# Patient Record
Sex: Male | Born: 1990 | Race: White | Hispanic: No | Marital: Single | Smoking: Never smoker
Health system: Southern US, Community
[De-identification: ages and names within clinical notes are randomized; demographics above are authoritative.]

## PROBLEM LIST (undated history)

## (undated) DIAGNOSIS — Z789 Other specified health status: Secondary | ICD-10-CM

## (undated) HISTORY — PX: KIDNEY SURGERY: SHX687

---

## 2015-08-18 NOTE — Progress Notes (Signed)
Please put orders in Epic surgery 09-08-15 pre op 09-03-15 Thanks 

## 2015-08-27 NOTE — Patient Instructions (Signed)
Shane Waters  08/27/2015   Your procedure is scheduled on: September 08, 2015  Report to [facility] Main  Entrance take [facility]  elevators to 3rd floor to  Short Stay Center at 7:30  AM.  Call this number if you have problems the morning of surgery [phone]   Remember: ONLY 1 PERSON MAY GO WITH YOU TO SHORT STAY TO GET  READY MORNING OF YOUR SURGERY.  Do not eat food or drink liquids :After Midnight.     Take these medicines the morning of surgery with A SIP OF WATER: None                               You may not have any metal on your body including hair pins and              piercings  Do not wear jewelry, , lotions, powders or perfumes, deodorant                        Men may shave face and neck.   Do not bring valuables to the hospital. [facility] IS NOT             RESPONSIBLE   FOR VALUABLES.  Contacts, dentures or bridgework may not be worn into surgery.       Patients discharged the day of surgery will not be allowed to drive home.  Name and phone number of your driver:  Special Instructions: coughing and deep breathing exercises, leg exercises              Please read over the following fact sheets you were given: _____________________________________________________________________             [facility] - Preparing for Surgery Before surgery, you can play an important role.  Because skin is not sterile, your skin needs to be as free of germs as possible.  You can reduce the number of germs on your skin by washing with CHG (chlorahexidine gluconate) soap before surgery.  CHG is an antiseptic cleaner which kills germs and bonds with the skin to continue killing germs even after washing. Please DO NOT use if you have an allergy to CHG or antibacterial soaps.  If your skin becomes reddened/irritated stop using the CHG and inform your nurse when you arrive at Short Stay. Do not shave (including legs and underarms) for at least 48 hours prior to  the first CHG shower.  You may shave your face/neck. Please follow these instructions carefully:  1.  Shower with CHG Soap the night before surgery and the  morning of Surgery.  2.  If you choose to wash your hair, wash your hair first as usual with your  normal  shampoo.  3.  After you shampoo, rinse your hair and body thoroughly to remove the  shampoo.                           4.  Use CHG as you would any other liquid soap.  You can apply chg directly  to the skin and wash                       Gently with a scrungie or clean washcloth.  5.  Apply the CHG Soap  to your body ONLY FROM THE NECK DOWN.   Do not use on face/ open                           Wound or open sores. Avoid contact with eyes, ears mouth and genitals (private parts).                       Wash face,  Genitals (private parts) with your normal soap.             6.  Wash thoroughly, paying special attention to the area where your surgery  will be performed.  7.  Thoroughly rinse your body with warm water from the neck down.  8.  DO NOT shower/wash with your normal soap after using and rinsing off  the CHG Soap.                9.  Pat yourself dry with a clean towel.            10.  Wear clean pajamas.            11.  Place clean sheets on your bed the night of your first shower and do not  sleep with pets. Day of Surgery : Do not apply any lotions/deodorants the morning of surgery.  Please wear clean clothes to the hospital/surgery center.  FAILURE TO FOLLOW THESE INSTRUCTIONS MAY RESULT IN THE CANCELLATION OF YOUR SURGERY PATIENT SIGNATURE_________________________________  NURSE SIGNATURE__________________________________  ________________________________________________________________________

## 2015-08-27 NOTE — Progress Notes (Signed)
08-27-15 - Need orders for surgery on September 28.  Patients preop appointment in on 09-03-15- at 1300. Thank you

## 2015-09-02 ENCOUNTER — Ambulatory Visit: Payer: Self-pay | Admitting: Surgery

## 2015-09-03 ENCOUNTER — Encounter (HOSPITAL_COMMUNITY)
Admission: RE | Admit: 2015-09-03 | Discharge: 2015-09-03 | Disposition: A | Discharge status: Home / Self Care | Payer: BLUE CROSS/BLUE SHIELD | Source: Ambulatory Visit | Attending: Surgery | Admitting: Surgery

## 2015-09-03 ENCOUNTER — Encounter (HOSPITAL_COMMUNITY): Payer: Self-pay

## 2015-09-03 DIAGNOSIS — K4090 Unilateral inguinal hernia, without obstruction or gangrene, not specified as recurrent: Secondary | ICD-10-CM | POA: Insufficient documentation

## 2015-09-03 DIAGNOSIS — Z01818 Encounter for other preprocedural examination: Secondary | ICD-10-CM | POA: Diagnosis not present

## 2015-09-03 HISTORY — DX: Other specified health status: Z78.9

## 2015-09-07 NOTE — Progress Notes (Signed)
Patient called and notified to arrive at Short Stay at [facility] at 0630 am . Patient verbalized understanding.

## 2015-09-07 NOTE — Anesthesia Preprocedure Evaluation (Addendum)
Anesthesia Evaluation  Patient identified by MRN, date of birth, ID band Patient awake    Reviewed: Allergy & Precautions, H&P , NPO status , Patient's Chart, lab work & pertinent test results  History of Anesthesia Complications Negative for: history of anesthetic complications  Airway Mallampati: I  TM Distance: >3 FB Neck ROM: full    Dental no notable dental hx.    Pulmonary neg pulmonary ROS,    Pulmonary exam normal breath sounds clear to auscultation       Cardiovascular negative cardio ROS  I Rhythm:regular Rate:Normal     Neuro/Psych negative neurological ROS  negative psych ROS   GI/Hepatic negative GI ROS, Neg liver ROS,   Endo/Other  negative endocrine ROS  Renal/GU negative Renal ROS  negative genitourinary   Musculoskeletal   Abdominal   Peds  Hematology negative hematology ROS (+)   Anesthesia Other Findings   Reproductive/Obstetrics negative OB ROS                             Anesthesia Physical Anesthesia Plan  ASA: I  Anesthesia Plan: General   Post-op Pain Management: GA combined w/ Regional for post-op pain   Induction:   Airway Management Planned: LMA  Additional Equipment:   Intra-op Plan:   Post-operative Plan: Extubation in OR  Informed Consent: I have reviewed the patients History and Physical, chart, labs and discussed the procedure including the risks, benefits and alternatives for the proposed anesthesia with the patient or authorized representative who has indicated his/her understanding and acceptance.     Plan Discussed with: CRNA and Surgeon  Anesthesia Plan Comments:         Anesthesia Quick Evaluation

## 2015-09-08 ENCOUNTER — Ambulatory Visit (HOSPITAL_COMMUNITY)
Admission: RE | Admit: 2015-09-08 | Discharge: 2015-09-08 | Disposition: A | Discharge status: Home / Self Care | Payer: BLUE CROSS/BLUE SHIELD | Source: Ambulatory Visit | Attending: Surgery | Admitting: Surgery

## 2015-09-08 ENCOUNTER — Encounter (HOSPITAL_COMMUNITY): Payer: Self-pay

## 2015-09-08 ENCOUNTER — Ambulatory Visit (HOSPITAL_COMMUNITY): Payer: BLUE CROSS/BLUE SHIELD | Admitting: Anesthesiology

## 2015-09-08 ENCOUNTER — Encounter (HOSPITAL_COMMUNITY)
Admission: RE | Disposition: A | Discharge status: Home / Self Care | Payer: Self-pay | Source: Ambulatory Visit | Attending: Surgery

## 2015-09-08 DIAGNOSIS — K4090 Unilateral inguinal hernia, without obstruction or gangrene, not specified as recurrent: Secondary | ICD-10-CM | POA: Insufficient documentation

## 2015-09-08 DIAGNOSIS — Z905 Acquired absence of kidney: Secondary | ICD-10-CM | POA: Diagnosis not present

## 2015-09-08 HISTORY — PX: INGUINAL HERNIA REPAIR: SHX194

## 2015-09-08 HISTORY — PX: INSERTION OF MESH: SHX5868

## 2015-09-08 SURGERY — REPAIR, HERNIA, INGUINAL, ADULT
Anesthesia: General | Site: Groin | Laterality: Left

## 2015-09-08 MED ORDER — 0.9 % SODIUM CHLORIDE (POUR BTL) OPTIME
TOPICAL | Status: DC | PRN
  Administered 2015-09-08: 1000 mL

## 2015-09-08 MED ORDER — PROPOFOL 10 MG/ML IV BOLUS
INTRAVENOUS | Status: DC | PRN
  Administered 2015-09-08 (×2): 200 mg via INTRAVENOUS

## 2015-09-08 MED ORDER — SODIUM CHLORIDE 0.9 % IJ SOLN: 3.0000 mL | INTRAMUSCULAR | Status: DC | PRN

## 2015-09-08 MED ORDER — CEFAZOLIN SODIUM-DEXTROSE 2-3 GM-% IV SOLR
2.0000 g | INTRAVENOUS | Status: AC
  Administered 2015-09-08: 2 g via INTRAVENOUS

## 2015-09-08 MED ORDER — CEFAZOLIN SODIUM-DEXTROSE 2-3 GM-% IV SOLR
INTRAVENOUS | Status: AC
  Filled 2015-09-08: qty 50

## 2015-09-08 MED ORDER — MIDAZOLAM HCL 2 MG/2ML IJ SOLN
INTRAMUSCULAR | Status: AC
  Filled 2015-09-08: qty 4

## 2015-09-08 MED ORDER — ACETAMINOPHEN 325 MG PO TABS: 650.0000 mg | ORAL_TABLET | ORAL | Status: DC | PRN

## 2015-09-08 MED ORDER — CHLORHEXIDINE GLUCONATE 4 % EX LIQD: 1.0000 "application " | Freq: Once | CUTANEOUS | Status: DC

## 2015-09-08 MED ORDER — PROPOFOL 10 MG/ML IV BOLUS
INTRAVENOUS | Status: AC
  Filled 2015-09-08: qty 20

## 2015-09-08 MED ORDER — ONDANSETRON HCL 4 MG/2ML IJ SOLN
INTRAMUSCULAR | Status: AC
  Filled 2015-09-08: qty 2

## 2015-09-08 MED ORDER — FENTANYL CITRATE (PF) 250 MCG/5ML IJ SOLN
INTRAMUSCULAR | Status: AC
  Filled 2015-09-08: qty 25

## 2015-09-08 MED ORDER — LIDOCAINE HCL 1 % IJ SOLN
INTRAMUSCULAR | Status: DC | PRN
  Administered 2015-09-08: 100 mg via INTRADERMAL

## 2015-09-08 MED ORDER — OXYCODONE HCL 5 MG PO TABS: 5.0000 mg | ORAL_TABLET | ORAL | Status: DC | PRN

## 2015-09-08 MED ORDER — BUPIVACAINE-EPINEPHRINE 0.5% -1:200000 IJ SOLN
INTRAMUSCULAR | Status: AC
  Filled 2015-09-08: qty 1

## 2015-09-08 MED ORDER — SODIUM CHLORIDE 0.9 % IV SOLN: 250.0000 mL | INTRAVENOUS | Status: DC | PRN

## 2015-09-08 MED ORDER — PROMETHAZINE HCL 25 MG/ML IJ SOLN: 6.2500 mg | INTRAMUSCULAR | Status: DC | PRN

## 2015-09-08 MED ORDER — HYDROMORPHONE HCL 1 MG/ML IJ SOLN
INTRAMUSCULAR | Status: AC
  Administered 2015-09-08: 0.5 mg
  Filled 2015-09-08: qty 1

## 2015-09-08 MED ORDER — HYDROMORPHONE HCL 1 MG/ML IJ SOLN: 0.2500 mg | INTRAMUSCULAR | Status: DC | PRN

## 2015-09-08 MED ORDER — SODIUM CHLORIDE 0.9 % IJ SOLN: 3.0000 mL | Freq: Two times a day (BID) | INTRAMUSCULAR | Status: DC

## 2015-09-08 MED ORDER — LIDOCAINE HCL 1 % IJ SOLN
INTRAMUSCULAR | Status: AC
  Filled 2015-09-08: qty 20

## 2015-09-08 MED ORDER — HYDROCODONE-ACETAMINOPHEN 5-325 MG PO TABS
1.0000 | ORAL_TABLET | Freq: Once | ORAL | Status: AC
  Administered 2015-09-08: 1 via ORAL
  Filled 2015-09-08: qty 1

## 2015-09-08 MED ORDER — HYDROMORPHONE HCL 1 MG/ML IJ SOLN
0.2500 mg | INTRAMUSCULAR | Status: DC | PRN
  Administered 2015-09-08: 0.5 mg via INTRAVENOUS

## 2015-09-08 MED ORDER — FENTANYL CITRATE (PF) 100 MCG/2ML IJ SOLN
INTRAMUSCULAR | Status: DC | PRN
  Administered 2015-09-08 (×7): 50 ug via INTRAVENOUS

## 2015-09-08 MED ORDER — MIDAZOLAM HCL 5 MG/5ML IJ SOLN
INTRAMUSCULAR | Status: DC | PRN
  Administered 2015-09-08 (×2): 1 mg via INTRAVENOUS

## 2015-09-08 MED ORDER — ROCURONIUM BROMIDE 100 MG/10ML IV SOLN
INTRAVENOUS | Status: AC
  Filled 2015-09-08: qty 1

## 2015-09-08 MED ORDER — HEPARIN SODIUM (PORCINE) 5000 UNIT/ML IJ SOLN
5000.0000 [IU] | Freq: Once | INTRAMUSCULAR | Status: AC
  Administered 2015-09-08: 5000 [IU] via SUBCUTANEOUS
  Filled 2015-09-08: qty 1

## 2015-09-08 MED ORDER — SODIUM CHLORIDE 0.9 % IJ SOLN
INTRAMUSCULAR | Status: AC
  Filled 2015-09-08: qty 10

## 2015-09-08 MED ORDER — HYDROCODONE-ACETAMINOPHEN 5-325 MG PO TABS: 1.0000 | ORAL_TABLET | ORAL | Status: DC | PRN

## 2015-09-08 MED ORDER — ACETAMINOPHEN 650 MG RE SUPP: 650.0000 mg | RECTAL | Status: DC | PRN

## 2015-09-08 MED ORDER — BUPIVACAINE LIPOSOME 1.3 % IJ SUSP
20.0000 mL | Freq: Once | INTRAMUSCULAR | Status: AC
  Administered 2015-09-08: 12 mL
  Filled 2015-09-08: qty 20

## 2015-09-08 MED ORDER — LIDOCAINE HCL (CARDIAC) 20 MG/ML IV SOLN
INTRAVENOUS | Status: AC
  Filled 2015-09-08: qty 5

## 2015-09-08 MED ORDER — BUPIVACAINE-EPINEPHRINE (PF) 0.25% -1:200000 IJ SOLN
INTRAMUSCULAR | Status: AC
  Filled 2015-09-08: qty 30

## 2015-09-08 MED ORDER — FENTANYL CITRATE (PF) 100 MCG/2ML IJ SOLN
INTRAMUSCULAR | Status: AC
  Filled 2015-09-08: qty 2

## 2015-09-08 MED ORDER — LACTATED RINGERS IV SOLN
INTRAVENOUS | Status: DC | PRN
  Administered 2015-09-08 (×2): via INTRAVENOUS

## 2015-09-08 SURGICAL SUPPLY — 34 items
BENZOIN TINCTURE PRP APPL 2/3 (GAUZE/BANDAGES/DRESSINGS) IMPLANT
BLADE HEX COATED 2.75 (ELECTRODE) ×3 IMPLANT
BLADE SURG 15 STRL LF DISP TIS (BLADE) ×1 IMPLANT
BLADE SURG 15 STRL SS (BLADE) ×2
CLOSURE WOUND 1/2 X4 (GAUZE/BANDAGES/DRESSINGS)
COVER SURGICAL LIGHT HANDLE (MISCELLANEOUS) ×3 IMPLANT
DECANTER SPIKE VIAL GLASS SM (MISCELLANEOUS) IMPLANT
DISSECTOR ROUND CHERRY 3/8 STR (MISCELLANEOUS) ×3 IMPLANT
DRAIN PENROSE 18X1/2 LTX STRL (DRAIN) ×3 IMPLANT
DRAPE LAPAROTOMY TRNSV 102X78 (DRAPE) ×3 IMPLANT
ELECT PENCIL ROCKER SW 15FT (MISCELLANEOUS) ×3 IMPLANT
ELECT REM PT RETURN 9FT ADLT (ELECTROSURGICAL) ×3
ELECTRODE REM PT RTRN 9FT ADLT (ELECTROSURGICAL) ×1 IMPLANT
GLOVE BIOGEL M 8.0 STRL (GLOVE) ×3 IMPLANT
GOWN STRL REUS W/TWL XL LVL3 (GOWN DISPOSABLE) ×6 IMPLANT
KIT BASIN OR (CUSTOM PROCEDURE TRAY) ×3 IMPLANT
LIQUID BAND (GAUZE/BANDAGES/DRESSINGS) ×3 IMPLANT
MESH HERNIA 3X6 (Mesh General) ×3 IMPLANT
NEEDLE HYPO 22GX1.5 SAFETY (NEEDLE) ×3 IMPLANT
PACK BASIC VI WITH GOWN DISP (CUSTOM PROCEDURE TRAY) ×3 IMPLANT
SPONGE LAP 4X18 X RAY DECT (DISPOSABLE) ×3 IMPLANT
STAPLER VISISTAT 35W (STAPLE) IMPLANT
STRIP CLOSURE SKIN 1/2X4 (GAUZE/BANDAGES/DRESSINGS) IMPLANT
SUT PROLENE 2 0 CT2 30 (SUTURE) ×9 IMPLANT
SUT SILK 2 0 SH (SUTURE) ×3 IMPLANT
SUT VIC AB 2-0 SH 27 (SUTURE) ×2
SUT VIC AB 2-0 SH 27X BRD (SUTURE) ×1 IMPLANT
SUT VIC AB 4-0 SH 18 (SUTURE) ×3 IMPLANT
SUT VICRYL 4-0 (SUTURE) ×3 IMPLANT
SYR 20CC LL (SYRINGE) ×3 IMPLANT
SYR BULB IRRIGATION 50ML (SYRINGE) ×3 IMPLANT
TOWEL OR 17X26 10 PK STRL BLUE (TOWEL DISPOSABLE) ×3 IMPLANT
TOWEL OR NON WOVEN STRL DISP B (DISPOSABLE) ×3 IMPLANT
YANKAUER SUCT BULB TIP 10FT TU (MISCELLANEOUS) ×3 IMPLANT

## 2015-09-08 NOTE — Discharge Instructions (Signed)
Inguinal Hernia, Adult  °Care After °Refer to this sheet in the next few weeks. These discharge instructions provide you with general information on caring for yourself after you leave the hospital. Your caregiver may also give you specific instructions. Your treatment has been planned according to the most current medical practices available, but unavoidable complications sometimes occur. If you have any problems or questions after discharge, please call your caregiver. °HOME CARE INSTRUCTIONS °· Put ice on the operative site. °¨ Put ice in a plastic bag. °¨ Place a towel between your skin and the bag. °¨ Leave the ice on for 15-20 minutes at a time, 03-04 times a day while awake. °· Change bandages (dressings) as directed. °· Keep the wound dry and clean. The wound may be washed gently with soap and water. Gently blot or dab the wound dry. It is okay to take showers 24 to 48 hours after surgery. Do not take baths, use swimming pools, or use hot tubs for 10 days, or as directed by your caregiver. °· Only take over-the-counter or prescription medicines for pain, discomfort, or fever as directed by your caregiver. °· Continue your normal diet as directed. °· Do not lift anything more than 10 pounds or play contact sports for 3 weeks, or as directed. °SEEK MEDICAL CARE IF: °· There is redness, swelling, or increasing pain in the wound. °· There is fluid (pus) coming from the wound. °· There is drainage from a wound lasting longer than 1 day. °· You have an oral temperature above 102° F (38.9° C). °· You notice a bad smell coming from the wound or dressing. °· The wound breaks open after the stitches (sutures) have been removed. °· You notice increasing pain in the shoulders (shoulder strap areas). °· You develop dizzy episodes or fainting while standing. °· You feel sick to your stomach (nauseous) or throw up (vomit). °SEEK IMMEDIATE MEDICAL CARE IF: °· You develop a rash. °· You have difficulty breathing. °· You  develop a reaction or have side effects to medicines you were given. °MAKE SURE YOU:  °· Understand these instructions. °· Will watch your condition. °· Will get help right away if you are not doing well or get worse. °Document Released: 12/28/2006 Document Revised: 02/19/2012 Document Reviewed: 10/27/2009 °ExitCare® Patient Information ©2015 [employer]. This information is not intended to replace advice given to you by your health care provider. Make sure you discuss any questions you have with your health care provider. ° °

## 2015-09-08 NOTE — Transfer of Care (Signed)
Immediate Anesthesia Transfer of Care Note  Patient: Shane Waters  Procedure(s) Performed: Procedure(s): OPEN LEFT INGUINAL HERNIA REPAIR WITH MESH (Left) INSERTION OF MESH (Left)  Patient Location: PACU  Anesthesia Type:General  Level of Consciousness: awake, alert  and sedated  Airway & Oxygen Therapy: Patient Spontanous Breathing and Patient connected to face mask oxygen  Post-op Assessment: Report given to RN and Post -op Vital signs reviewed and stable  Post vital signs: Reviewed and stable  Last Vitals:  Filed Vitals:   09/08/15 0628  BP: 121/75  Pulse: 73  Temp: 36.6 C  Resp: 16    Complications: No apparent anesthesia complications

## 2015-09-08 NOTE — Interval H&P Note (Signed)
History and Physical Interval Note:  09/08/2015 8:07 AM  Shane Waters  has presented today for surgery, with the diagnosis of LEFT INGUINAL HERNIA  The various methods of treatment have been discussed with the patient and family. After consideration of risks, benefits and other options for treatment, the patient has consented to  Procedure(s): OPEN LEFT INGUINAL HERNIA REPAIR WITH MESH (Left) INSERTION OF MESH (Left) as a surgical intervention .  The patient's history has been reviewed, patient examined, no change in status, stable for surgery.  I have reviewed the patient's chart and labs.  Questions were answered to the patient's satisfaction.     MARTIN,MATTHEW B

## 2015-09-08 NOTE — H&P (Signed)
Shane Waters  Location: [facility] Patient #: [id number] DOB: [birth year]  Male  History of Present Illness  Patient words: LIH  24 year old employee of [employer] with a several month history of a left inguinal hernia. It has not obstructed but is bothering him. His father had a hernia repair in [place] last year and he is aware of the procedure. I explained the procedure again to him including complications of nerve pain, numbness, and recurrence. Hernia book given to him. He wants to proceed with open left inguinal hernia repair.  T    Other Problems Fay Records, CMA; 07/29/2015 10:05 AM) Inguinal Hernia  Past Surgical History Fay Records, CMA; 07/29/2015 10:05 AM) Nephrectomy Right.  Diagnostic Studies History Fay Records, [place]; 07/29/2015 10:05 AM) Colonoscopy never  Allergies Fay Records, CMA; 07/29/2015 10:05 AM) No Known Drug Allergies08/18/2016  Medication History Fay Records, [place]; 07/29/2015 10:05 AM) No Current Medications Medications Reconciled  Social History Fay Records, [place]; 07/29/2015 10:05 AM) Alcohol use Occasional alcohol use. Caffeine use Carbonated beverages. No drug use Tobacco use Never smoker.  Family History Fay Records, [place]; 07/29/2015 10:05 AM) Heart disease in male family member before age 15  Review of Systems Fay Records CMA; 07/29/2015 10:05 AM) General Not Present- Appetite Loss, Chills, Fatigue, Fever, Night Sweats, Weight Gain and Weight Loss. Skin Not Present- Change in Wart/Mole, Dryness, Hives, Jaundice, New Lesions, Non-Healing Wounds, Rash and Ulcer. HEENT Not Present- Earache, Hearing Loss, Hoarseness, Nose Bleed, Oral Ulcers, Ringing in the Ears, Seasonal Allergies, Sinus Pain, Sore Throat, Visual Disturbances, Wears glasses/contact lenses and Yellow Eyes. Respiratory Not Present- Bloody sputum, Chronic Cough, Difficulty Breathing, Snoring and Wheezing. Breast Not Present- Breast Mass, Breast Pain, Nipple Discharge  and Skin Changes. Cardiovascular Not Present- Chest Pain, Difficulty Breathing Lying Down, Leg Cramps, Palpitations, Rapid Heart Rate, Shortness of Breath and Swelling of Extremities. Gastrointestinal Not Present- Abdominal Pain, Bloating, Bloody Stool, Change in Bowel Habits, Chronic diarrhea, Constipation, Difficulty Swallowing, Excessive gas, Gets full quickly at meals, Hemorrhoids, Indigestion, Nausea, Rectal Pain and Vomiting. Male Genitourinary Not Present- Blood in Urine, Change in Urinary Stream, Frequency, Impotence, Nocturia, Painful Urination, Urgency and Urine Leakage. Musculoskeletal Not Present- Back Pain, Joint Pain, Joint Stiffness, Muscle Pain, Muscle Weakness and Swelling of Extremities. Neurological Not Present- Decreased Memory, Fainting, Headaches, Numbness, Seizures, Tingling, Tremor, Trouble walking and Weakness. Psychiatric Not Present- Anxiety, Bipolar, Change in Sleep Pattern, Depression, Fearful and Frequent crying. Endocrine Not Present- Cold Intolerance, Excessive Hunger, Hair Changes, Heat Intolerance, Hot flashes and New Diabetes. Hematology Not Present- Easy Bruising, Excessive bleeding, Gland problems, HIV and Persistent Infections.   Vitals Fay Records CMA; 07/29/2015 10:06 AM) 07/29/2015 10:05 AM Weight: 198 lb Height: 73in Body Surface Area: 2.15 m Body Mass Index: 26.12 kg/m Temp.: 97.53F(Oral)  Pulse: 67 (Regular)  BP: 128/70 (Sitting, Left Arm, Standard)    Physical Exam (Matthew B. Daphine Deutscher MD; 07/29/2015 10:41 AM) General Note: WM in no distress   Chest and Lung Exam Note: Clear   Cardiovascular Note: SR without murmurs   Male Genitourinary Note: Shaved genitalia; no right inguinal hernia, there is a bulge high in the canal and fullness incorporating the scrotum consistent with a left scrotal hernia or hernia with hydrocoele.     Assessment & Plan Molli Hazard B. Daphine Deutscher MD; 07/29/2015 10:42 AM) LEFT INGUINAL HERNIA (550.90   K40.90) Impression: Will schedule open left inguinal hernia repair with mesh.   Matt B. Daphine Deutscher, MD, FACS

## 2015-09-08 NOTE — Anesthesia Procedure Notes (Addendum)
Anesthesia Regional Block:  TAP block  Pre-Anesthetic Checklist: ,, timeout performed, Correct Patient, Correct Site, Correct Laterality, Correct Procedure, Correct Position, site marked, Risks and benefits discussed, pre-op evaluation, post-op pain management  Laterality: Left  Prep: chloraprep       Needles:  Injection technique: Single-shot      Additional Needles:  Procedures: ultrasound guided (picture in chart) TAP block Narrative:  Start time: 09/08/2015 7:51 AM   Anesthesia Regional Block:  TAP block  Pre-Anesthetic Checklist: ,, timeout performed, Correct Patient, Correct Site, Correct Laterality, Correct Procedure, Correct Position, site marked, Risks and benefits discussed,  Surgical consent,  Pre-op evaluation,  At surgeon's request and post-op pain management  Laterality: Left and Lower  Prep: chloraprep       Needles:   Needle Type: Echogenic Stimulator Needle     Needle Length: 9cm 9 cm Needle Gauge: 21 and 21 G  Needle insertion depth: 5 cm   Additional Needles:  Procedures: ultrasound guided (picture in chart) TAP block Narrative:  Start time: 09/08/2015 7:45 AM End time: 09/08/2015 7:58 AM  Performed by: Personally  Anesthesiologist: MASSAGEE, TERRY   Procedure Name: LMA Insertion Date/Time: 09/08/2015 8:28 AM Performed by: Donn Pierini Pre-anesthesia Checklist: Patient identified, Emergency Drugs available, Suction available and Patient being monitored Patient Re-evaluated:Patient Re-evaluated prior to inductionOxygen Delivery Method: Circle system utilized and Simple face mask Preoxygenation: Pre-oxygenation with 100% oxygen Intubation Type: IV induction Ventilation: Mask ventilation without difficulty LMA: LMA with gastric port inserted LMA Size: 5.0 Number of attempts: 1 Placement Confirmation: positive ETCO2 and breath sounds checked- equal and bilateral Dental Injury: Teeth and Oropharynx as per pre-operative assessment

## 2015-09-08 NOTE — Op Note (Signed)
NAME:  Shane Waters, Shane Waters NO.:  [id number]  MEDICAL RECORD NO.:  [id number]  LOCATION:  [facility]                         FACILITY:  [facility]  PHYSICIAN:  Thornton Park. Daphine Deutscher, MD  DATE OF BIRTH:  [birth year]  DATE OF PROCEDURE:  09/08/2015 DATE OF DISCHARGE:  09/08/2015                              OPERATIVE REPORT   PREOPERATIVE DIAGNOSIS:  Longstanding history of left inguinal hernia- left scrotal hernia repair, open left inguinal hernia repair with mesh.  POSTOPERATIVE DIAGNOSIS:  PROCEDURE:  Large left inguinal scrotal hernia with longstanding sac attached to the cord structures, status post open left inguinal hernia, with careful dissection of sac from cord structures, identification of internal landmarks, including sigmoid colon at the proximal neck with pursestring ligation under direct vision, using 2-0 silk and excision of the excess sac.  Repair of floor with Marlex mesh, cut to fit, and sutured along the floor with 2-0 Prolene.  SURGEON:  Luretha Murphy, M.D.  ASSISTANT:  None.  ANESTHESIA:  General with postop Exparel.  DESCRIPTION OF PROCEDURE:  A small oblique incision was made in the left inguinal region, after a time-out was performed.  The patient had a PCMX prep.  This was carried down to the fatty tissue to the external oblique, which in the structures were identified and the fibers were incised along their structure to the external wing, which was open. Cord was mobilized and no direct hernia was seen.  Anatomy was very easily visualized.  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  I went ahead and opened it proximally and with my finger in place, meticulously dissected it from the cremasteric fibers and the cord  structures.  I found the distal most point, where it was attached to the down in the scrotum region and actually transected a portion of the sac to leave that structure with the cord structures down in the scrotum and then peeled it away proximally to reveal a neck.  I did a high ligation with 2-0 silk pursestring under direct vision using looking down inside the sac and sutured then under direct vision and performing a high ligation, excising the excess sac, send it for Path and reducing that into the abdomen.  The internal ring was approximated with a single suture of 2-0 Prolene medially to snug up the ring and then the floor was repaired with a piece of Marlex-type mesh, sutured along the inguinal ligaments, running 2-0 Prolene, immediately with a 2- 0 Prolene and sutured it to itself with a horizontal 2-0 Prolene.  It was tucked beneath the external oblique, which was then closed with a running 2-0 Vicryl.  4-0 Vicryl used to approximate the subcutaneous tissue and then the skin was closed with running subcuticular 5-0 Monocryl.  A liquid band was used on the skin.  The patient tolerated procedure well, and he was taken to recovery room in satisfactory condition.     Thornton Park Daphine Deutscher, MD     MBM/MEDQ  D:  09/08/2015  T:  09/08/2015  Job:  [id number]

## 2015-09-08 NOTE — Anesthesia Postprocedure Evaluation (Signed)
  Anesthesia Post-op Note  Patient: Shane Waters  Procedure(s) Performed: Procedure(s): OPEN LEFT INGUINAL HERNIA REPAIR WITH MESH (Left) INSERTION OF MESH (Left)  Patient Location: PACU  Anesthesia Type:GA combined with regional for post-op pain  Level of Consciousness: awake and alert   Airway and Oxygen Therapy: Patient Spontanous Breathing  Post-op Pain: mild  Post-op Assessment: Post-op Vital signs reviewed              Post-op Vital Signs: stable  Last Vitals:  Filed Vitals:   09/08/15 1319  BP: 139/73  Pulse: 70  Temp:   Resp: 18    Complications: No apparent anesthesia complications

## 2015-09-08 NOTE — Brief Op Note (Signed)
09/08/2015  10:45 AM  PATIENT:  Shane Waters  24 y.o. male  PRE-OPERATIVE DIAGNOSIS:  LEFT INGUINAL HERNIA  POST-OPERATIVE DIAGNOSIS:  LEFT INGUINAL HERNIA  PROCEDURE:  Procedure(s): OPEN LEFT INGUINAL HERNIA REPAIR WITH MESH (Left) INSERTION OF MESH (Left)  SURGEON:  Surgeon(s) and Role:    * Luretha Murphy, MD - Primary  PHYSICIAN ASSISTANT:   ASSISTANTS: none   ANESTHESIA:   general  EBL:  Total I/O In: 1400 [I.V.:1400] Out: -   BLOOD ADMINISTERED:none  DRAINS: none   LOCAL MEDICATIONS USED:  BUPIVICAINE   SPECIMEN:  Source of Specimen:  indirect hernia sac  DISPOSITION OF SPECIMEN:  PATHOLOGY  COUNTS:  YES  TOURNIQUET:  * No tourniquets in log *  DICTATION: .Other Dictation: Dictation Number [id number]  PLAN OF CARE: Discharge to home after PACU  PATIENT DISPOSITION:  PACU - hemodynamically stable.   Delay start of Pharmacological VTE agent (>24hrs) due to surgical blood loss or risk of bleeding: not applicable

## 2015-09-10 ENCOUNTER — Encounter (HOSPITAL_COMMUNITY): Payer: Self-pay | Admitting: Emergency Medicine

## 2015-09-10 ENCOUNTER — Emergency Department (HOSPITAL_COMMUNITY): Payer: BLUE CROSS/BLUE SHIELD

## 2015-09-10 ENCOUNTER — Emergency Department (HOSPITAL_COMMUNITY)
Admission: EM | Admit: 2015-09-10 | Discharge: 2015-09-10 | Disposition: A | Discharge status: Home / Self Care | Payer: BLUE CROSS/BLUE SHIELD | Attending: Emergency Medicine | Admitting: Emergency Medicine

## 2015-09-10 DIAGNOSIS — K4090 Unilateral inguinal hernia, without obstruction or gangrene, not specified as recurrent: Secondary | ICD-10-CM

## 2015-09-10 DIAGNOSIS — R1909 Other intra-abdominal and pelvic swelling, mass and lump: Secondary | ICD-10-CM | POA: Diagnosis present

## 2015-09-10 DIAGNOSIS — Z9889 Other specified postprocedural states: Secondary | ICD-10-CM | POA: Insufficient documentation

## 2015-09-10 LAB — CBC WITH DIFFERENTIAL/PLATELET
BASOS PCT: 0 %
Basophils Absolute: 0 10*3/uL (ref 0.0–0.1)
EOS ABS: 0.1 10*3/uL (ref 0.0–0.7)
Eosinophils Relative: 1 %
HEMATOCRIT: 41 % (ref 39.0–52.0)
HEMOGLOBIN: 14.6 g/dL (ref 13.0–17.0)
LYMPHS ABS: 1.9 10*3/uL (ref 0.7–4.0)
Lymphocytes Relative: 20 %
MCH: 29.5 pg (ref 26.0–34.0)
MCHC: 35.6 g/dL (ref 30.0–36.0)
MCV: 82.8 fL (ref 78.0–100.0)
MONO ABS: 1.2 10*3/uL — AB (ref 0.1–1.0)
MONOS PCT: 13 %
NEUTROS ABS: 6.1 10*3/uL (ref 1.7–7.7)
NEUTROS PCT: 66 %
Platelets: 221 10*3/uL (ref 150–400)
RBC: 4.95 MIL/uL (ref 4.22–5.81)
RDW: 12 % (ref 11.5–15.5)
WBC: 9.2 10*3/uL (ref 4.0–10.5)

## 2015-09-10 LAB — COMPREHENSIVE METABOLIC PANEL
ALK PHOS: 48 U/L (ref 38–126)
ALT: 10 U/L — ABNORMAL LOW (ref 17–63)
ANION GAP: 8 (ref 5–15)
AST: 17 U/L (ref 15–41)
Albumin: 4.4 g/dL (ref 3.5–5.0)
BILIRUBIN TOTAL: 0.5 mg/dL (ref 0.3–1.2)
BUN: 8 mg/dL (ref 6–20)
CO2: 28 mmol/L (ref 22–32)
Calcium: 9 mg/dL (ref 8.9–10.3)
Chloride: 102 mmol/L (ref 101–111)
Creatinine, Ser: 0.87 mg/dL (ref 0.61–1.24)
GFR calc non Af Amer: >60 mL/min (ref >60)
Glucose, Bld: 109 mg/dL — ABNORMAL HIGH (ref 65–99)
POTASSIUM: 3.5 mmol/L (ref 3.5–5.1)
Sodium: 138 mmol/L (ref 135–145)
TOTAL PROTEIN: 7.5 g/dL (ref 6.5–8.1)

## 2015-09-10 LAB — LIPASE, BLOOD: LIPASE: 42 U/L (ref 22–51)

## 2015-09-10 MED ORDER — MORPHINE SULFATE (PF) 4 MG/ML IV SOLN
4.0000 mg | Freq: Once | INTRAVENOUS | Status: AC
  Administered 2015-09-10: 4 mg via INTRAVENOUS
  Filled 2015-09-10: qty 1

## 2015-09-10 MED ORDER — HYDROCODONE-ACETAMINOPHEN 5-325 MG PO TABS: 1.0000 | ORAL_TABLET | ORAL | Status: AC | PRN

## 2015-09-10 MED ORDER — IOHEXOL 300 MG/ML  SOLN
50.0000 mL | Freq: Once | INTRAMUSCULAR | Status: AC | PRN
  Administered 2015-09-10: 50 mL via ORAL

## 2015-09-10 MED ORDER — SODIUM CHLORIDE 0.9 % IV BOLUS (SEPSIS)
1000.0000 mL | Freq: Once | INTRAVENOUS | Status: AC
  Administered 2015-09-10: 1000 mL via INTRAVENOUS

## 2015-09-10 MED ORDER — NAPROXEN 500 MG PO TABS: 500.0000 mg | ORAL_TABLET | Freq: Two times a day (BID) | ORAL | Status: AC

## 2015-09-10 MED ORDER — IOHEXOL 300 MG/ML  SOLN
100.0000 mL | Freq: Once | INTRAMUSCULAR | Status: AC | PRN
  Administered 2015-09-10: 100 mL via INTRAVENOUS

## 2015-09-10 NOTE — ED Provider Notes (Signed)
CSN: [id number]     Arrival date & time 09/10/15  4098 History   By signing my name below, I, Terrance Branch, attest that this documentation has been prepared under the direction and in the presence of Tomasita Crumble, MD. Electronically Signed: Evon Slack, ED Scribe. 09/10/2015. 4:03 AM.   Chief Complaint  Patient presents with  . Post-op Problem   The history is provided by the patient. No language interpreter was used.    HPI Comments: Shane Waters is a 24 y.o. male who presents to the Emergency Department complaining of post op problem onset today. Pt states that he had left inguinal hernia repair with mesh insertion 2 days prior. Pt states that today he has increased pain and swelling. Pt states that he has been taking Norco for his pain. Pt denies nausea, fever or vomiting.    Past Medical History  Diagnosis Date  . Medical history non-contributory    Past Surgical History  Procedure Laterality Date  . Kidney surgery      at 33 weeks old-had extra tube on kidney that needed to be removed  . Inguinal hernia repair Left 09/08/2015    Procedure: OPEN LEFT INGUINAL HERNIA REPAIR WITH MESH;  Surgeon: Luretha Murphy, MD;  Location: [facility];  Service: General;  Laterality: Left;  . Insertion of mesh Left 09/08/2015    Procedure: INSERTION OF MESH;  Surgeon: Luretha Murphy, MD;  Location: [facility];  Service: General;  Laterality: Left;   Family History  Problem Relation Age of Onset  . Hypertension Other    Social History  Substance Use Topics  . Smoking status: Never Smoker   . Smokeless tobacco: None  . Alcohol Use: Yes     Comment: occassionally    Review of Systems  Constitutional: Negative for fever.  Gastrointestinal: Negative for nausea and vomiting.  Genitourinary: Positive for scrotal swelling.  All other systems reviewed and are negative.    Allergies  Review of patient's allergies indicates no known allergies.  Home Medications   Prior to Admission medications    Medication Sig Start Date End Date Taking? Authorizing Provider  acetaminophen (TYLENOL) 650 MG CR tablet Take 650 mg by mouth every 8 (eight) hours as needed for pain.   Yes Historical Provider, MD  HYDROcodone-acetaminophen (NORCO) 5-325 MG tablet Take 1 tablet by mouth every 4 (four) hours as needed for moderate pain. 09/08/15  Yes Luretha Murphy, MD  ibuprofen (ADVIL,MOTRIN) 200 MG tablet Take 400-800 mg by mouth every 6 (six) hours as needed for headache.   Yes Historical Provider, MD   BP 152/80 mmHg  Pulse 75  Temp(Src) 98.9 F (37.2 C) (Oral)  Resp 16  Ht  (1.854 m)  Wt 195 lb (88.451 kg)  BMI 25.73 kg/m2  SpO2 100%   Physical Exam  Constitutional: He is oriented to person, place, and time. Vital signs are normal. He appears well-developed and well-nourished.  Non-toxic appearance. He does not appear ill. No distress.  HENT:  Head: Normocephalic and atraumatic.  Nose: Nose normal.  Mouth/Throat: Oropharynx is clear and moist. No oropharyngeal exudate.  Eyes: Conjunctivae and EOM are normal. Pupils are equal, round, and reactive to light. No scleral icterus.  Neck: Normal range of motion. Neck supple. No tracheal deviation, no edema, no erythema and normal range of motion present. No thyroid mass and no thyromegaly present.  Cardiovascular: Normal rate, regular rhythm, S1 normal, S2 normal, normal heart sounds, intact distal pulses and normal pulses.  Exam reveals  no gallop and no friction rub.   No murmur heard. Pulses:      Radial pulses are 2+ on the right side, and 2+ on the left side.       Dorsalis pedis pulses are 2+ on the right side, and 2+ on the left side.  Pulmonary/Chest: Effort normal and breath sounds normal. No respiratory distress. He has no wheezes. He has no rhonchi. He has no rales.  Abdominal: Soft. Normal appearance and bowel sounds are normal. He exhibits no distension, no ascites and no mass. There is no hepatosplenomegaly. There is no tenderness.  There is no rebound, no guarding and no CVA tenderness. A hernia is present. Hernia confirmed positive in the left inguinal area.  Genitourinary:  Left indirect hernia, no signs of strangulation.   Musculoskeletal: Normal range of motion. He exhibits no edema or tenderness.  Lymphadenopathy:    He has no cervical adenopathy.  Neurological: He is alert and oriented to person, place, and time. He has normal strength. No cranial nerve deficit or sensory deficit.  Skin: Skin is warm, dry and intact. No petechiae and no rash noted. He is not diaphoretic. No erythema. No pallor.  Psychiatric: He has a normal mood and affect. His behavior is normal. Judgment normal.  Nursing note and vitals reviewed.   ED Course  Procedures (including critical care time) DIAGNOSTIC STUDIES: Oxygen Saturation is 100% on RA, normal by my interpretation.    COORDINATION OF CARE: 3:54 AM-Discussed treatment plan with pt at bedside and pt agreed to plan.    Labs Review Labs Reviewed  CBC WITH DIFFERENTIAL/PLATELET - Abnormal; Notable for the following:    Monocytes Absolute 1.2 (*)    All other components within normal limits  COMPREHENSIVE METABOLIC PANEL - Abnormal; Notable for the following:    Glucose, Bld 109 (*)    ALT 10 (*)    All other components within normal limits  LIPASE, BLOOD    Imaging Review Ct Abdomen Pelvis W Contrast  09/10/2015   CLINICAL DATA:  Recent hernia repair, left indirect, with bulge in the left scrotum. Evaluate for recurrent hernia.  EXAM: CT ABDOMEN AND PELVIS WITH CONTRAST  TECHNIQUE: Multidetector CT imaging of the abdomen and pelvis was performed using the standard protocol following bolus administration of intravenous contrast.  CONTRAST:  OMNIPAQUE IOHEXOL 300 MG/ML  SOLN  COMPARISON:  None.  FINDINGS: Lower chest and abdominal wall: There is swelling in gas in the left groin correlating with inguinal hernia repair 2 days ago. No recurrent hernia is seen. Edematous  changes extend into the left spermatic cord and upper scrotum, without fluid collection or hematoma. Prominent left scrotal veins, but not specific for varicocele in this setting.  Hepatobiliary: No focal liver abnormality.No evidence of biliary obstruction or stone.  Pancreas: Unremarkable.  Spleen: Unremarkable.  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 the left ureter. No hydronephrosis or stone. Unremarkable bladder.  Reproductive:As above.  Stomach/Bowel:  No obstruction. Normal appendix.  Vascular/Lymphatic: No acute vascular abnormality. No mass or adenopathy.  Peritoneal: Trace pelvic fluid, considered reactive.  Musculoskeletal: No acute abnormalities.  IMPRESSION: Expected findings after recent left inguinal hernia repair. Negative for recurrence.   Electronically Signed   By: Marnee Spring M.D.   On: 09/10/2015 06:04      EKG Interpretation None      MDM   Final diagnoses:  None   Patient presents to the ED for worsening L scrotal pain after surgery 2 days ago.  He also feels a mass in the area and is concerned for repeat hernia.  CT scan is negative for hernia, but shows edema vs. Infection.  Patient is not febrile, has no WBC, and otherwise appears well outside of expected tenderness.  This arose 2 days later however which is concerning for post op infection.  I spoke with Dr. Michaell Cowing for bedside ED evaluation of the patient to ensure this is not an infection that needs to be treated.  He was given morphine for pain control in the ED and which did help significantly.   Dr. Michaell Cowing has evaluated the patient and believes this is normal post-op pain.  He has provided more pain medication for the patient.  He appears well and in NAD.  His VS remain within his normal limits and he is safe for DC.   I personally performed the services described in this documentation, which was scribed in my presence. The recorded information has been reviewed and is accurate.     Tomasita Crumble, MD 09/10/15 [phone]

## 2015-09-10 NOTE — Consult Note (Signed)
[facility]  [address] [place], [place] [address] Phone: [phone] FAX: [phone]     Von Quintanar  1991/10/26 [id number]  CARE TEAM:  PCP: Pcp Not In System  Outpatient Care Team: Patient Care Team: Pcp Not In System as PCP - General  Inpatient Treatment Team: Treatment Team: Attending Provider: Everlene Balls, MD; Registered Nurse: Rip Harbour, RN  This patient is a 24 y.o.male who presents today for surgical evaluation at the request of Dr. Claudine Mouton.   Reason for evaluation: Left groin pain 2 days status post open left inguinal hernia repair of large left scrotal hernia.  Called by Dr. Claudine Mouton.  Radiologist concerned about possible infection.  Young male with large left scrotal hernia underwent open repair with mesh 2 days ago.  Apparently walked in the middle of night emergency room complaining of worsening pain.  Some swelling.  Emergency doctor got CT scan.  Radiologist raise question of infection.  Dr. Claudine Mouton not comfortable to discharge patient home without surgical evaluation.  Patient is having flatus but no bowel movement since surgery.  No prone with urination.  He has been sticking to liquids.  Was rather nauseated the first night but not nauseated now.  Energy level okay.  Not walking around much.  Hydrocodone usually controlled pretty well.  Conflicting message of perhaps worsening pain but patient actually says not that bad right now.  Main concern was he was afraid he had popped a recurrent hernia and was strangulated.  Past Medical History  Diagnosis Date  . Medical history non-contributory     Past Surgical History  Procedure Laterality Date  . Kidney surgery      at 32 weeks old-had extra tube on kidney that needed to be removed  . Inguinal hernia repair Left 09/08/2015    Procedure: OPEN LEFT INGUINAL HERNIA REPAIR WITH MESH;  Surgeon: Johnathan Hausen, MD;  Location: [facility];  Service: General;  Laterality: Left;   . Insertion of mesh Left 09/08/2015    Procedure: INSERTION OF MESH;  Surgeon: Johnathan Hausen, MD;  Location: [facility];  Service: General;  Laterality: Left;    Social History   Social History  . Marital Status: Single    Spouse Name: N/A  . Number of Children: N/A  . Years of Education: N/A   Occupational History  . Not on file.   Social History Main Topics  . Smoking status: Never Smoker   . Smokeless tobacco: Not on file  . Alcohol Use: Yes     Comment: occassionally  . Drug Use: No  . Sexual Activity: Not on file   Other Topics Concern  . Not on file   Social History Narrative    Family History  Problem Relation Age of Onset  . Hypertension Other     No current facility-administered medications for this encounter.   Current Outpatient Prescriptions  Medication Sig Dispense Refill  . acetaminophen (TYLENOL) 650 MG CR tablet Take 650 mg by mouth every 8 (eight) hours as needed for pain.    Marland Kitchen HYDROcodone-acetaminophen (NORCO) 5-325 MG tablet Take 1 tablet by mouth every 4 (four) hours as needed for moderate pain. 30 tablet 0  . ibuprofen (ADVIL,MOTRIN) 200 MG tablet Take 400-800 mg by mouth every 6 (six) hours as needed for headache.       No Known Allergies  ROS: Constitutional:  No fevers, chills, sweats.  Weight stable Eyes:  No vision changes, No discharge HENT:  No sore throats, nasal drainage Lymph: No neck swelling, No bruising easily Pulmonary:  No cough, productive sputum CV: No orthopnea, PND  Patient walks 30 minutes for about 1 miles without difficulty.  No exertional chest/neck/shoulder/arm pain. GI:  No personal nor family history of GI/colon cancer, inflammatory bowel disease, irritable bowel syndrome, allergy such as Celiac Sprue, dietary/dairy problems, colitis, ulcers nor gastritis.  No recent sick contacts/gastroenteritis.  No travel outside the country.  No changes in diet. Renal: No UTIs, No hematuria Genital:  No drainage, bleeding,  masses Musculoskeletal: No severe joint pain.  Good ROM major joints Skin:  No sores or lesions.  No rashes Heme/Lymph:  No easy bleeding.  No swollen lymph nodes Neuro: No focal weakness/numbness.  No seizures Psych: No suicidal ideation.  No hallucinations  BP 149/79 mmHg  Pulse 77  Temp(Src) 98.9 F (37.2 C) (Oral)  Resp 15  Ht _0  (1.854 m)  Wt 88.451 kg (195 lb)  BMI 25.73 kg/m2  SpO2 100%  Physical Exam: General: Pt awake/alert/oriented x4 in no major acute distress Eyes: PERRL, normal EOM. Sclera nonicteric Neuro: CN II-XII intact w/o focal sensory/motor deficits. Lymph: No head/neck/groin lymphadenopathy Psych:  No delerium/psychosis/paranoia HENT: Normocephalic, Mucus membranes moist.  No thrush Neck: Supple, No tracheal deviation Chest: No pain.  Good respiratory excursion. CV:  Pulses intact.  Regular rhythm Abdomen: Soft, Nondistended.  Nontender.  No incarcerated hernias. Gen:  Circumcised male.  Left scrotum somewhat enlarged and edematous.  Sensitive.  Testicle okay.  Left groin incision consistent with inguinal hernia.  Normal healing ridge.  No cellulitis.  No fluctuance.  No guarding.  No ecchymosis.  No hematoma.  No crepitus. Ext:  SCDs BLE.  No significant edema.  No cyanosis Skin: No petechiae / purpurea.  No major sores Musculoskeletal: No severe joint pain.  Good ROM major joints   Results:   Labs: Results for orders placed or performed during the hospital encounter of 09/10/15 (from the past 48 hour(s))  CBC with Differential/Platelet     Status: Abnormal   Collection Time: 09/10/15  4:24 AM  Result Value Ref Range   WBC 9.2 4.0 - 10.5 K/uL   RBC 4.95 4.22 - 5.81 MIL/uL   Hemoglobin 14.6 13.0 - 17.0 g/dL   HCT 41.0 39.0 - 52.0 %   MCV 82.8 78.0 - 100.0 fL   MCH 29.5 26.0 - 34.0 pg   MCHC 35.6 30.0 - 36.0 g/dL   RDW 12.0 11.5 - 15.5 %   Platelets 221 150 - 400 K/uL   Neutrophils Relative % 66 %   Neutro Abs 6.1 1.7 - 7.7 K/uL   Lymphocytes  Relative 20 %   Lymphs Abs 1.9 0.7 - 4.0 K/uL   Monocytes Relative 13 %   Monocytes Absolute 1.2 (H) 0.1 - 1.0 K/uL   Eosinophils Relative 1 %   Eosinophils Absolute 0.1 0.0 - 0.7 K/uL   Basophils Relative 0 %   Basophils Absolute 0.0 0.0 - 0.1 K/uL  Comprehensive metabolic panel     Status: Abnormal   Collection Time: 09/10/15  4:24 AM  Result Value Ref Range   Sodium 138 135 - 145 mmol/L   Potassium 3.5 3.5 - 5.1 mmol/L   Chloride 102 101 - 111 mmol/L   CO2 28 22 - 32 mmol/L   Glucose, Bld 109 (H) 65 - 99 mg/dL   BUN 8 6 - 20 mg/dL   Creatinine, Ser 0.87 0.61 - 1.24 mg/dL   Calcium 9.0  8.9 - 10.3 mg/dL   Total Protein 7.5 6.5 - 8.1 g/dL   Albumin 4.4 3.5 - 5.0 g/dL   AST 17 15 - 41 U/L   ALT 10 (L) 17 - 63 U/L   Alkaline Phosphatase 48 38 - 126 U/L   Total Bilirubin 0.5 0.3 - 1.2 mg/dL   GFR calc non Af Amer >60 >60 mL/min   GFR calc Af Amer >60 >60 mL/min    Comment: (NOTE) The eGFR has been calculated using the CKD EPI equation. This calculation has not been validated in all clinical situations. eGFR's persistently <60 mL/min signify possible Chronic Kidney Disease.    Anion gap 8 5 - 15  Lipase, blood     Status: None   Collection Time: 09/10/15  4:24 AM  Result Value Ref Range   Lipase 42 22 - 51 U/L    Imaging / Studies: Ct Abdomen Pelvis W Contrast  09/10/2015   CLINICAL DATA:  Recent hernia repair, left indirect, with bulge in the left scrotum. Evaluate for recurrent hernia.  EXAM: CT ABDOMEN AND PELVIS WITH CONTRAST  TECHNIQUE: Multidetector CT imaging of the abdomen and pelvis was performed using the standard protocol following bolus administration of intravenous contrast.  CONTRAST:  150m OMNIPAQUE IOHEXOL 300 MG/ML  SOLN  COMPARISON:  None.  FINDINGS: Lower chest and abdominal wall: There is swelling in gas in the left groin correlating with inguinal hernia repair 2 days ago. No recurrent hernia is seen. Edematous changes extend into the left spermatic cord and  upper scrotum, without fluid collection or hematoma. Prominent left scrotal veins, but not specific for varicocele in this setting.  Hepatobiliary: No focal liver abnormality.No evidence of biliary obstruction or stone.  Pancreas: Unremarkable.  Spleen: Unremarkable.  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 the left ureter. No hydronephrosis or stone. Unremarkable bladder.  Reproductive:As above.  Stomach/Bowel:  No obstruction. Normal appendix.  Vascular/Lymphatic: No acute vascular abnormality. No mass or adenopathy.  Peritoneal: Trace pelvic fluid, considered reactive.  Musculoskeletal: No acute abnormalities.  IMPRESSION: Expected findings after recent left inguinal hernia repair. Negative for recurrence.   Electronically Signed   By: JMonte FantasiaM.D.   On: 09/10/2015 06:04    Medications / Allergies: per chart  Antibiotics: Anti-infectives    None      Assessment  JKiara Keep 23y.o. male       Problem List:  Principal Problem:   Scrotal hernia s/p open repair w mesh  Left groin pain and scrotal swelling consistent with postoperative day 2 repair of inguinal hernia.  No evidence of hernia recurrence or infection.  Plan:  Discussed with the patient and his significant other.  He has understandable sensitivity but there is no evidence by exam or CT scan that he has a life-threatening problem such as cellulitis necrotizing fasciitis or recurrent hernia with incarceration / strangulation.  I believe because he had such a large hernia and had pain that made him wonder if it recurred and he wished to be evaluated to not miss anything.  His pain is much better now.  He feels reassured.  He feels comfortable going home.  -VTE prophylaxis- SCDs, etc -mobilize as tolerated to help recovery    SAdin Hector M.D., F.A.C.S. [facility] [address] [place]  [phone] / Paging   09/10/2015  Note: Portions of this report may have been transcribed using voice  recognition software. Every effort was made to ensure accuracy; however, inadvertent computerized transcription errors may be present.   Any transcriptional errors that result from this process are unintentional.

## 2015-09-10 NOTE — ED Notes (Signed)
Pt states he had an left inguinal hernia repair done on Wednesday by Dr Daphine Deutscher  Pt states tonight the pain increased and pt has swelling noted at op site  No drainage from incision

## 2015-09-10 NOTE — Discharge Instructions (Signed)
Inguinal Hernia, Adult  Care After Shane Waters, your CT scan results are below.  See Dr. Daphine Deutscher in clinic within 3 days for close follow up.  Continue pain medication as directed.  If any symptoms worsen, come back to the ED immediately.  Thank you.  FINDINGS: Lower chest and abdominal wall: There is swelling in gas in the left groin correlating with inguinal hernia repair 2 days ago. No recurrent hernia is seen. Edematous changes extend into the left spermatic cord and upper scrotum, without fluid collection or hematoma. Prominent left scrotal veins, but not specific for varicocele in this setting.  Hepatobiliary: No focal liver abnormality.No evidence of biliary obstruction or stone.  Pancreas: Unremarkable.  Spleen: Unremarkable.  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 the left ureter. No hydronephrosis or stone. Unremarkable bladder.  Reproductive:As above.  Stomach/Bowel: No obstruction. Normal appendix.  Vascular/Lymphatic: No acute vascular abnormality. No mass or adenopathy.  Peritoneal: Trace pelvic fluid, considered reactive.  Musculoskeletal: No acute abnormalities.  IMPRESSION: Expected findings after recent left inguinal hernia repair. Negative for recurrence.   HERNIA REPAIR: POST OP INSTRUCTIONS  1. DIET: Follow a light bland diet the first 24 hours after arrival home, such as soup, liquids, crackers, etc.  Be sure to include lots of fluids daily.  Avoid fast food or heavy meals as your are more likely to get nauseated.  Eat a low fat the next few days after surgery. 2. Take your usually prescribed home medications unless otherwise directed. 3. PAIN CONTROL: a. Pain is best controlled by a usual combination of three different methods TOGETHER: i. Ice/Heat ii. Over the counter pain medication iii. Prescription pain medication b. Most patients will experience some swelling and bruising around the hernia(s) such as the  bellybutton, groins, or old incisions.  Ice packs or heating pads (30-60 minutes up to 6 times a day) will help. Use ice for the first few days to help decrease swelling and bruising, then switch to heat to help relax tight/sore spots and speed recovery.  Some people prefer to use ice alone, heat alone, alternating between ice & heat.  Experiment to what works for you.  Swelling and bruising can take several weeks to resolve.   c. It is helpful to take an over-the-counter pain medication regularly for the first few weeks.  Choose one of the following that works best for you: i. Naproxen (Aleve, etc)  Two  tabs twice a day ii. Ibuprofen (Advil, etc) Three  tabs four times a day (every meal & bedtime) iii. Acetaminophen (Tylenol, etc) 325-650mg  four times a day (every meal & bedtime) d. A  prescription for pain medication should be given to you upon discharge.  Take your pain medication as prescribed.  i. If you are having problems/concerns with the prescription medicine (does not control pain, nausea, vomiting, rash, itching, etc), please call us [phone] to see if we need to switch you to a different pain medicine that will work better for you and/or control your side effect better. ii. If you need a refill on your pain medication, please contact your pharmacy.  They will contact our office to request authorization. Prescriptions will not be filled after 5 pm or on week-ends. 4. Avoid getting constipated.  Between the surgery and the pain medications, it is common to experience some constipation.  Increasing fluid intake and taking a fiber supplement (such as Metamucil, Citrucel, FiberCon, MiraLax, etc) 1-2 times a day regularly will usually help prevent this problem from  occurring.  A mild laxative (prune juice, Milk of Magnesia, MiraLax, etc) should be taken according to package directions if there are no bowel movements after 48 hours.   5. Wash / shower every day.  You may shower over  the dressings as they are waterproof.   6. Remove your waterproof bandages 5 days after surgery.  You may leave the incision open to air.  You may replace a dressing/Band-Aid to cover the incision for comfort if you wish.  Continue to shower over incision(s) after the dressing is off.    7. ACTIVITIES as tolerated:   a. You may resume regular (light) daily activities beginning the next day--such as daily self-care, walking, climbing stairs--gradually increasing activities as tolerated.  If you can walk 30 minutes without difficulty, it is safe to try more intense activity such as jogging, treadmill, bicycling, low-impact aerobics, swimming, etc. b. Save the most intensive and strenuous activity for last such as sit-ups, heavy lifting, contact sports, etc  Refrain from any heavy lifting or straining until you are off narcotics for pain control.   c. DO NOT PUSH THROUGH PAIN.  Let pain be your guide: If it hurts to do something, don't do it.  Pain is your body warning you to avoid that activity for another week until the pain goes down. d. You may drive when you are no longer taking prescription pain medication, you can comfortably wear a seatbelt, and you can safely maneuver your car and apply brakes. e. [name] may have sexual intercourse when it is comfortable.  8. FOLLOW UP in our office a. Please call [facility] at [phone] to set up an appointment to see your surgeon in the office for a follow-up appointment approximately 2-3 weeks after your surgery. b. Make sure that you call for this appointment the day you arrive home to insure a convenient appointment time. 9.  IF YOU HAVE DISABILITY OR FAMILY LEAVE FORMS, BRING THEM TO THE OFFICE FOR PROCESSING.  DO NOT GIVE THEM TO YOUR DOCTOR.  WHEN TO CALL us [phone]: 1. Poor pain control 2. Reactions / problems with new medications (rash/itching, nausea, etc)  3. Fever over 101.5 F (38.5 C) 4. Inability to urinate 5. Nausea and/or  vomiting 6. Worsening swelling or bruising 7. Continued bleeding from incision. 8. Increased pain, redness, or drainage from the incision   The clinic staff is available to answer your questions during regular business hours (8:30am-5pm).  Please dont hesitate to call and ask to speak to one of our nurses for clinical concerns.   If you have a medical emergency, go to the nearest emergency room or call 911.  A surgeon from [facility] is always on call at the hospitals in [facility], [place] [address], [address], [place], [place]  [address] ?  [address] [address], [place], [place]   [address] MAIN: [phone] ? TOLL FREE: [phone] ? FAX: [phone] [email or web address]  Managing Pain  Pain after surgery or related to activity is often due to strain/injury to muscle, tendon, nerves and/or incisions.  This pain is usually short-term and will improve in a few months.   Many people find it helpful to do the following things TOGETHER to help speed the process of healing and to get back to regular activity more quickly:  1. Avoid heavy physical activity at first a. No lifting greater than 20 pounds at first, then increase to lifting as tolerated over the next few weeks  b. Do not push through the pain.  Listen to your body and avoid positions and maneuvers than reproduce the pain.  Wait a few days before trying something more intense c. Walking is okay as tolerated, but go slowly and stop when getting sore.  If you can walk 30 minutes without stopping or pain, you can try more intense activity (running, jogging, aerobics, cycling, swimming, treadmill, sex, sports, weightlifting, etc ) d. Remember: If it hurts to do it, then dont do it!  2. Take Anti-inflammatory medication a. Choose ONE of the following over-the-counter medications: i.            Acetaminophen 500mg  tabs (Tylenol) 1-2 pills with every meal and just before bedtime  (avoid if you have liver problems) ii.            Naproxen 220mg  tabs (ex. Aleve) 1-2 pills twice a day (avoid if you have kidney, stomach, IBD, or bleeding problems) iii. Ibuprofen 200mg  tabs (ex. Advil, Motrin) 3-4 pills with every meal and just before bedtime (avoid if you have kidney, stomach, IBD, or bleeding problems) b. Take with food/snack around the clock for 1-2 weeks i. This helps the muscle and nerve tissues become less irritable and calm down faster  3. Use a Heating pad or Ice/Cold Pack a. 4-6 times a day b. May use warm bath/hottub  or showers  4. Try Gentle Massage and/or Stretching  a. at the area of pain many times a day b. stop if you feel pain - do not overdo it  Try these steps together to help you body heal faster and avoid making things get worse.  Doing just one of these things may not be enough.    If you are not getting better after two weeks or are noticing you are getting worse, contact our office for further advice; we may need to re-evaluate you & see what other things we can do to help.  GETTING TO GOOD BOWEL HEALTH. Irregular bowel habits such as constipation and diarrhea can lead to many problems over time.  Having one soft bowel movement a day is the most important way to prevent further problems.  The anorectal canal is designed to handle stretching and feces to safely manage our ability to get rid of solid waste (feces, poop, stool) out of our body.  BUT, hard constipated stools can act like ripping concrete bricks and diarrhea can be a burning fire to this very sensitive area of our body, causing inflamed hemorrhoids, anal fissures, increasing risk is perirectal abscesses, abdominal pain/bloating, an making irritable bowel worse.      The goal: ONE SOFT BOWEL MOVEMENT A DAY!  To have soft, regular bowel movements:   Drink plenty of fluids, consider 4-6 tall glasses of water a day.    Take plenty of fiber.  Fiber is the undigested part of plant food that  passes into the colon, acting s natures broom to encourage bowel motility and movement.  Fiber can absorb and hold large amounts of water. This results in a larger, bulkier stool, which is soft and easier to pass. Work gradually over several weeks up to 6 servings a day of fiber (25g a day even more if needed) in the form of: o Vegetables -- Root (potatoes, carrots, turnips), leafy green (lettuce, salad greens, celery, spinach), or cooked high residue (cabbage, broccoli, etc) o Fruit -- Fresh (unpeeled skin & pulp), Dried (prunes, apricots, cherries, etc ),  or stewed ( applesauce)  o Whole  grain breads, pasta, etc (whole wheat)  o Bran cereals   Bulking Agents -- This type of water-retaining fiber generally is easily obtained each day by one of the following:  o Psyllium bran -- The psyllium plant is remarkable because its ground seeds can retain so much water. This product is available as Metamucil, Konsyl, Effersyllium, Per Diem Fiber, or the less expensive generic preparation in drug and health food stores. Although labeled a laxative, it really is not a laxative.  o Methylcellulose -- This is another fiber derived from wood which also retains water. It is available as Citrucel. o Polyethylene Glycol - and artificial fiber commonly called Miralax or Glycolax.  It is helpful for people with gassy or bloated feelings with regular fiber o Flax Seed - a less gassy fiber than psyllium  No reading or other relaxing activity while on the toilet. If bowel movements take longer than 5 minutes, you are too constipated  AVOID CONSTIPATION.  High fiber and water intake usually takes care of this.  Sometimes a laxative is needed to stimulate more frequent bowel movements, but   Laxatives are not a good long-term solution as it can wear the colon out.  They can help jump-start bowels if constipated, but should be relied on constantly without discussing with your doctor o Osmotics (Milk of Magnesia, Fleets  phosphosoda, Magnesium citrate, MiraLax, GoLytely) are safer than  o Stimulants (Senokot, Castor Oil, Dulcolax, Ex Lax)    o Avoid taking laxatives for more than 7 days in a row.   IF SEVERELY CONSTIPATED, try a Bowel Retraining Program: o Do not use laxatives.  o Eat a diet high in roughage, such as bran cereals and leafy vegetables.  o Drink six (6) ounces of prune or apricot juice each morning.  o Eat two (2) large servings of stewed fruit each day.  o Take one (1) heaping tablespoon of a psyllium-based bulking agent twice a day. Use sugar-free sweetener when possible to avoid excessive calories.  o Eat a normal breakfast.  o Set aside 15 minutes after breakfast to sit on the toilet, but do not strain to have a bowel movement.  o If you do not have a bowel movement by the third day, use an enema and repeat the above steps.   Controlling diarrhea o Switch to liquids and simpler foods for a few days to avoid stressing your intestines further. o Avoid dairy products (especially milk & ice cream) for a short time.  The intestines often can lose the ability to digest lactose when stressed. o Avoid foods that cause gassiness or bloating.  Typical foods include beans and other legumes, cabbage, broccoli, and dairy foods.  Every person has some sensitivity to other foods, so listen to our body and avoid those foods that trigger problems for you. o Adding fiber (Citrucel, Metamucil, psyllium, Miralax) gradually can help thicken stools by absorbing excess fluid and retrain the intestines to act more normally.  Slowly increase the dose over a few weeks.  Too much fiber too soon can backfire and cause cramping & bloating. o Probiotics (such as active yogurt, Align, etc) may help repopulate the intestines and colon with normal bacteria and calm down a sensitive digestive tract.  Most studies show it to be of mild help, though, and such products can be costly. o Medicines: - Bismuth subsalicylate (ex.  Kayopectate, Pepto Bismol) every 30 minutes for up to 6 doses can help control diarrhea.  Avoid if pregnant. - Loperamide (Immodium)  can slow down diarrhea.  Start with two tablets (4mg  total) first and then try one tablet every 6 hours.  Avoid if you are having fevers or severe pain.  If you are not better or start feeling worse, stop all medicines and call your doctor for advice o Call your doctor if you are getting worse or not better.  Sometimes further testing (cultures, endoscopy, X-ray studies, bloodwork, etc) may be needed to help diagnose and treat the cause of the diarrhea.  TROUBLESHOOTING IRREGULAR BOWELS 1) Avoid extremes of bowel movements (no bad constipation/diarrhea) 2) Miralax 17gm mixed in 8oz. water or juice-daily. May use BID as needed.  3) Gas-x,Phazyme, etc. as needed for gas & bloating.  4) Soft,bland diet. No spicy,greasy,fried foods.  5) Prilosec over-the-counter as needed  6) May hold gluten/wheat products from diet to see if symptoms improve.  7)  May try probiotics (Align, Activa, etc) to help calm the bowels down 7) If symptoms become worse call back immediately.   Inguinal Hernia, Adult Muscles help keep everything in the body in its proper place. But if a weak spot in the muscles develops, something can poke through. That is called a hernia. When this happens in the lower part of the belly (abdomen), it is called an inguinal hernia. (It takes its name from a part of the body in this region called the inguinal canal.) A weak spot in the wall of muscles lets some fat or part of the small intestine bulge through. An inguinal hernia can develop at any age. Men get them more often than women. CAUSES  In adults, an inguinal hernia develops over time.  It can be triggered by:  Suddenly straining the muscles of the lower abdomen.  Lifting heavy objects.  Straining to have a bowel movement. Difficult bowel movements (constipation) can lead to this.  Constant  coughing. This may be caused by smoking or lung disease.  Being overweight.  Being pregnant.  Working at a job that requires long periods of standing or heavy lifting.  Having had an inguinal hernia before. One type can be an emergency situation. It is called a strangulated inguinal hernia. It develops if part of the small intestine slips through the weak spot and cannot get back into the abdomen. The blood supply can be cut off. If that happens, part of the intestine may die. This situation requires emergency surgery. SYMPTOMS  Often, a small inguinal hernia has no symptoms. It is found when a healthcare provider does a physical exam. Larger hernias usually have symptoms.   In adults, symptoms may include:  A lump in the groin. This is easier to see when the person is standing. It might disappear when lying down.  In men, a lump in the scrotum.  Pain or burning in the groin. This occurs especially when lifting, straining or coughing.  A dull ache or feeling of pressure in the groin.  Signs of a strangulated hernia can include:  A bulge in the groin that becomes very painful and tender to the touch.  A bulge that turns red or purple.  Fever, nausea and vomiting.  Inability to have a bowel movement or to pass gas. DIAGNOSIS  To decide if you have an inguinal hernia, a healthcare provider will probably do a physical examination.  This will include asking questions about any symptoms you have noticed.  The healthcare provider might feel the groin area and ask you to cough. If an inguinal hernia is felt, the healthcare  provider may try to slide it back into the abdomen.  Usually no other tests are needed. TREATMENT  Treatments can vary. The size of the hernia makes a difference. Options include:  Watchful waiting. This is often suggested if the hernia is small and you have had no symptoms.  No medical procedure will be done unless symptoms develop.  You will need to watch  closely for symptoms. If any occur, contact your healthcare provider right away.  Surgery. This is used if the hernia is larger or you have symptoms.  Open surgery. This is usually an outpatient procedure (you will not stay overnight in a hospital). An cut (incision) is made through the skin in the groin. The hernia is put back inside the abdomen. The weak area in the muscles is then repaired by herniorrhaphy or hernioplasty. Herniorrhaphy: in this type of surgery, the weak muscles are sewn back together. Hernioplasty: a patch or mesh is used to close the weak area in the abdominal wall.  Laparoscopy. In this procedure, a surgeon makes small incisions. A thin tube with a tiny video camera (called a laparoscope) is put into the abdomen. The surgeon repairs the hernia with mesh by looking with the video camera and using two long instruments. HOME CARE INSTRUCTIONS   After surgery to repair an inguinal hernia:  You will need to take pain medicine prescribed by your healthcare provider. Follow all directions carefully.  You will need to take care of the wound from the incision.  Your activity will be restricted for awhile. This will probably include no heavy lifting for several weeks. You also should not do anything too active for a few weeks. When you can return to work will depend on the type of job that you have.  During "watchful waiting" periods, you should:  Maintain a healthy weight.  Eat a diet high in fiber (fruits, vegetables and whole grains).  Drink plenty of fluids to avoid constipation. This means drinking enough water and other liquids to keep your urine clear or pale yellow.  Do not lift heavy objects.  Do not stand for long periods of time.  Quit smoking. This should keep you from developing a frequent cough. SEEK MEDICAL CARE IF:   A bulge develops in your groin area.  You feel pain, a burning sensation or pressure in the groin. This might be worse if you are lifting  or straining.  You develop a fever of more than 100.5 F (38.1 C). SEEK IMMEDIATE MEDICAL CARE IF:   Pain in the groin increases suddenly.  A bulge in the groin gets bigger suddenly and does not go down.  For men, there is sudden pain in the scrotum. Or, the size of the scrotum increases.  A bulge in the groin area becomes red or purple and is painful to touch.  You have nausea or vomiting that does not go away.  You feel your heart beating much faster than normal.  You cannot have a bowel movement or pass gas.  You develop a fever of more than 102.0 F (38.9 C). Document Released: 04/15/2009 Document Revised: 02/19/2012 Document Reviewed: 04/15/2009 [facility] Patient Information 2015 [place], [place]. This information is not intended to replace advice given to you by your health care provider. Make sure you discuss any questions you have with your health care provider.   Refer to this sheet in the next few weeks. These discharge instructions provide you with general information on caring for yourself after you leave the  hospital. Your caregiver may also give you specific instructions. Your treatment has been planned according to the most current medical practices available, but unavoidable complications sometimes occur. If you have any problems or questions after discharge, please call your caregiver. HOME CARE INSTRUCTIONS  Put ice on the operative site.  Put ice in a plastic bag.  Place a towel between your skin and the bag.  Leave the ice on for 15-20 minutes at a time, 03-04 times a day while awake.  Change bandages (dressings) as directed.  Keep the wound dry and clean. The wound may be washed gently with soap and water. Gently blot or dab the wound dry. It is okay to take showers 24 to 48 hours after surgery. Do not take baths, use swimming pools, or use hot tubs for 10 days, or as directed by your caregiver.  Only take over-the-counter or prescription medicines for  pain, discomfort, or fever as directed by your caregiver.  Continue your normal diet as directed.  Do not lift anything more than 10 pounds or play contact sports for 3 weeks, or as directed. SEEK MEDICAL CARE IF:  There is redness, swelling, or increasing pain in the wound.  There is fluid (pus) coming from the wound.  There is drainage from a wound lasting longer than 1 day.  You have an oral temperature above 102 F (38.9 C).  You notice a bad smell coming from the wound or dressing.  The wound breaks open after the stitches (sutures) have been removed.  You notice increasing pain in the shoulders (shoulder strap areas).  You develop dizzy episodes or fainting while standing.  You feel sick to your stomach (nauseous) or throw up (vomit). SEEK IMMEDIATE MEDICAL CARE IF:  You develop a rash.  You have difficulty breathing.  You develop a reaction or have side effects to medicines you were given. MAKE SURE YOU:   Understand these instructions.  Will watch your condition.  Will get help right away if you are not doing well or get worse. Document Released: 12/28/2006 Document Revised: 02/19/2012 Document Reviewed: 10/27/2009 [facility] Patient Information 2015 [place], [place]. This information is not intended to replace advice given to you by your health care provider. Make sure you discuss any questions you have with your health care provider.

## 2015-09-10 NOTE — ED Notes (Addendum)
Verbalized understanding discharge instructions and follow-up. In no acute distress.  Pt educated not to drive while taking pain medication.

## 2016-10-24 IMAGING — CT CT ABD-PELV W/ CM
2 of 5 series · 16 of 46 positions shown, 18 images · IV contrast (OMNIPAQUE 300)
Comparison: None.

CLINICAL DATA: Recent hernia repair, left indirect, with bulge in
the left scrotum. Evaluate for recurrent hernia.

EXAM:
CT ABDOMEN AND PELVIS WITH CONTRAST
TECHNIQUE: Multidetector CT imaging of the abdomen and pelvis was performed
using the standard protocol following bolus administration of
intravenous contrast.
CONTRAST:  100mL OMNIPAQUE IOHEXOL 300 MG/ML  SOLN

[Series 2: abd/pel with · axial · 0.74mm/px · z∈[-302,+118]mm · 13 of 96 slices shown, 15 images]
[im 6/96  soft-tissue]
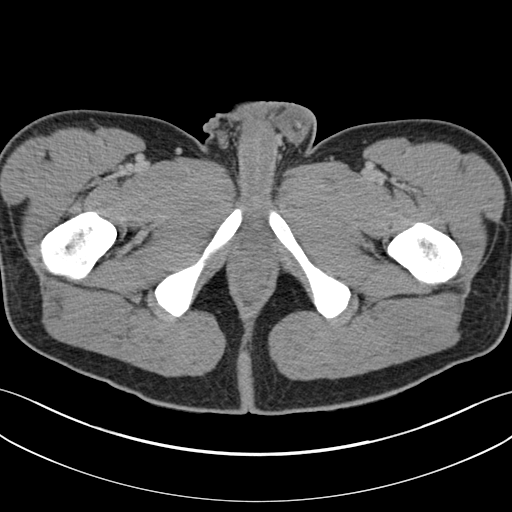
[im 6/96  bone]
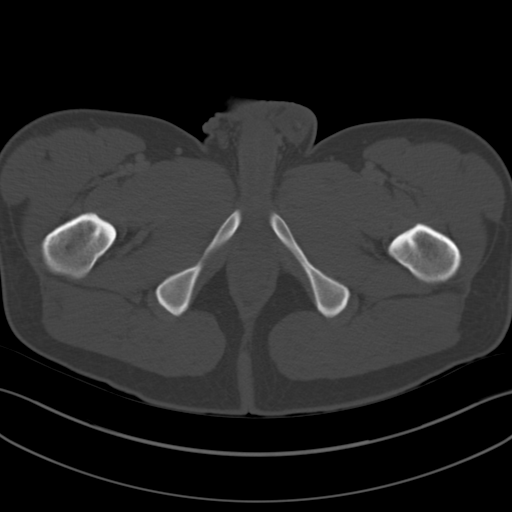
[im 12/96  soft-tissue]
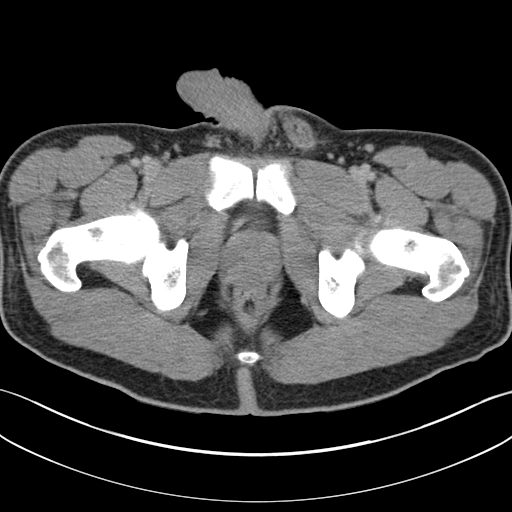
[im 23/96  soft-tissue]
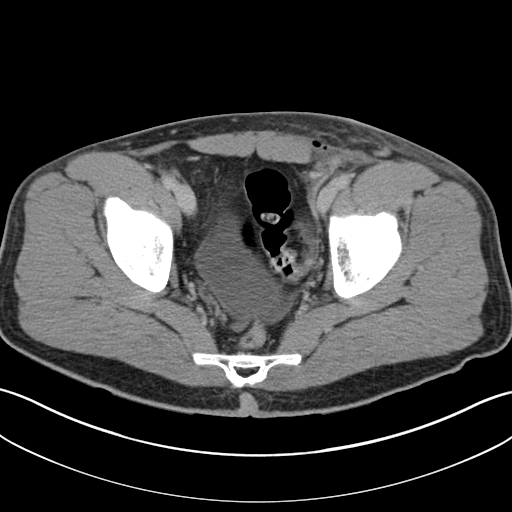
[im 28/96  soft-tissue]
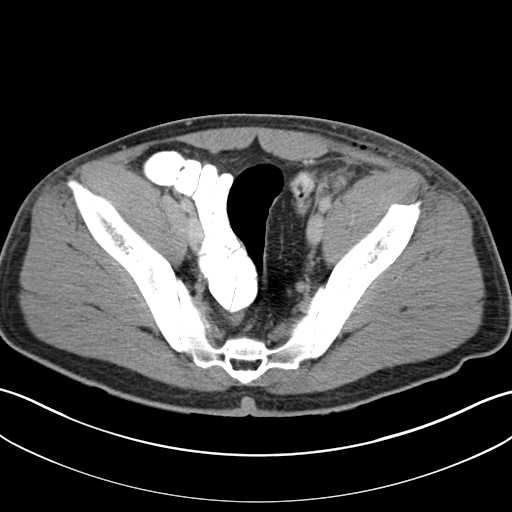
[im 34/96  soft-tissue]
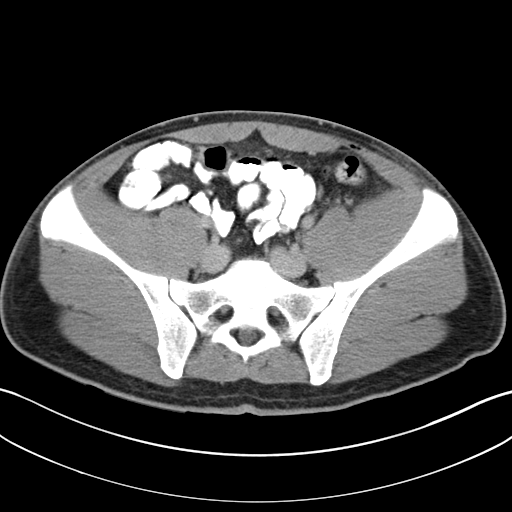
[im 40/96  soft-tissue]
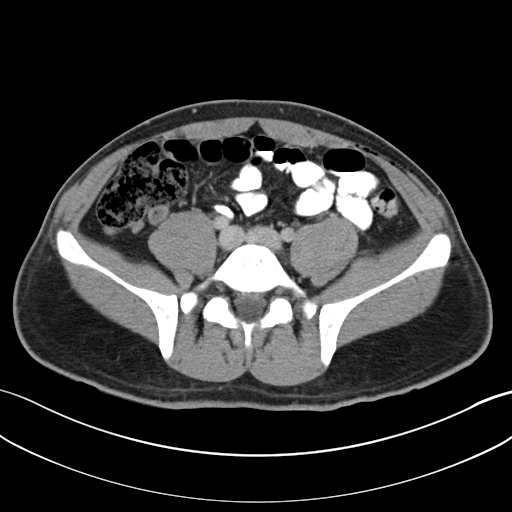
[im 51/96  soft-tissue]
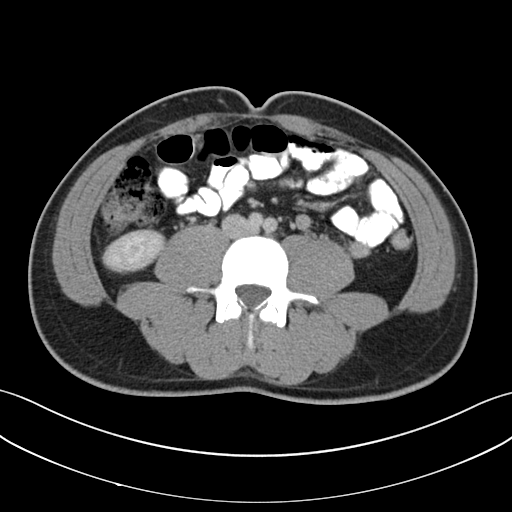
[im 56/96  soft-tissue]
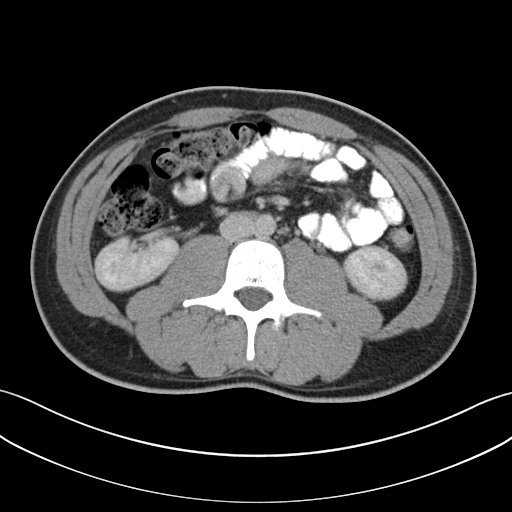
[im 62/96  soft-tissue]
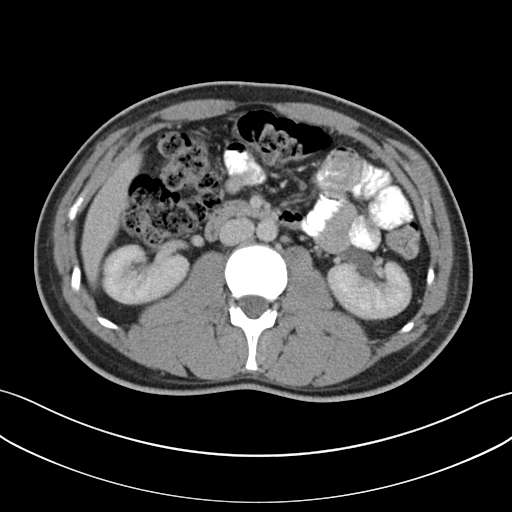
[im 62/96  bone]
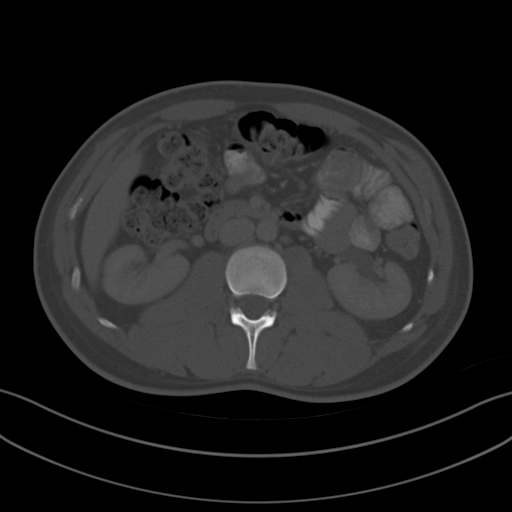
[im 68/96  soft-tissue]
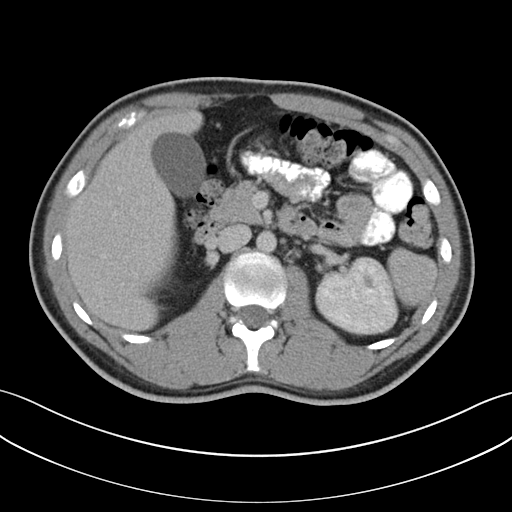
[im 73/96  soft-tissue]
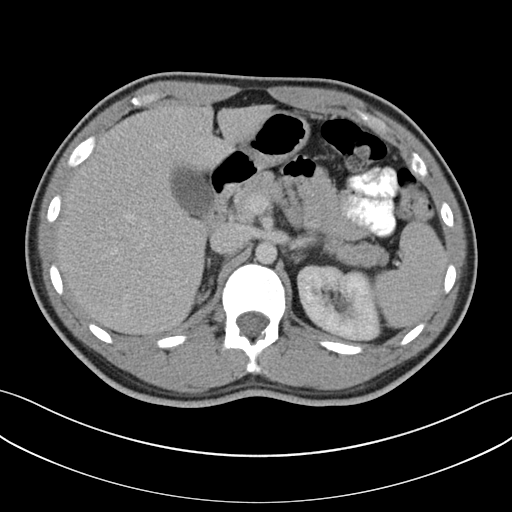
[im 84/96  soft-tissue]
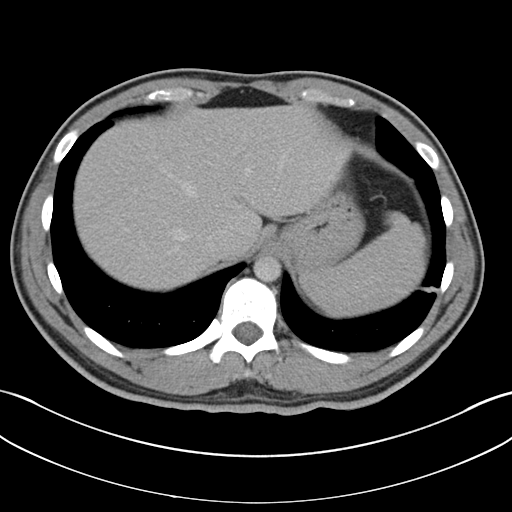
[im 90/96  soft-tissue]
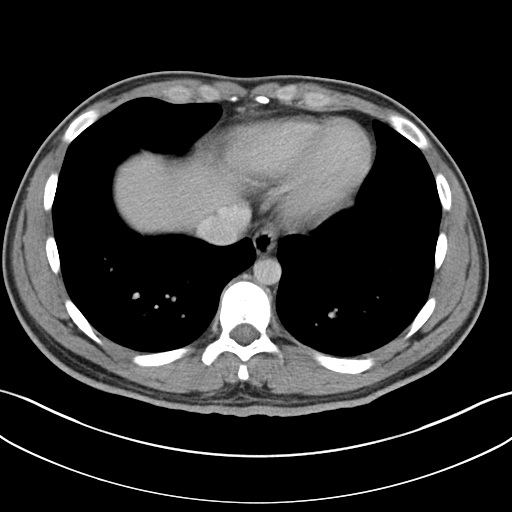

[Series 3: coronal a/|p · coronal · 0.74mm/px · 3 of 84 slices shown]
[im 28/84  soft-tissue]
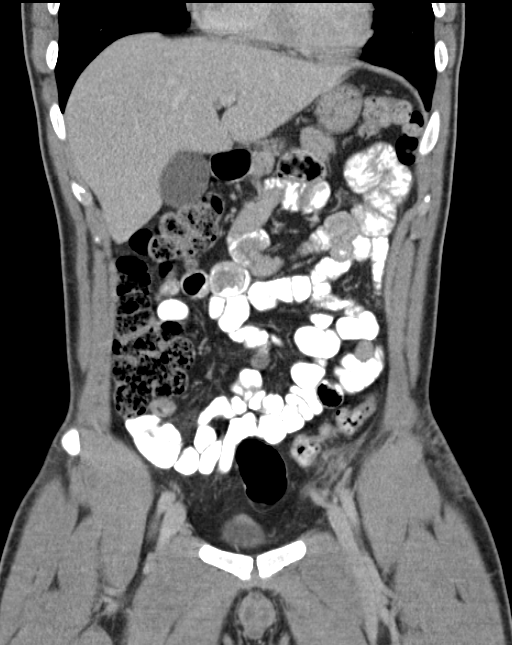
[im 37/84  soft-tissue]
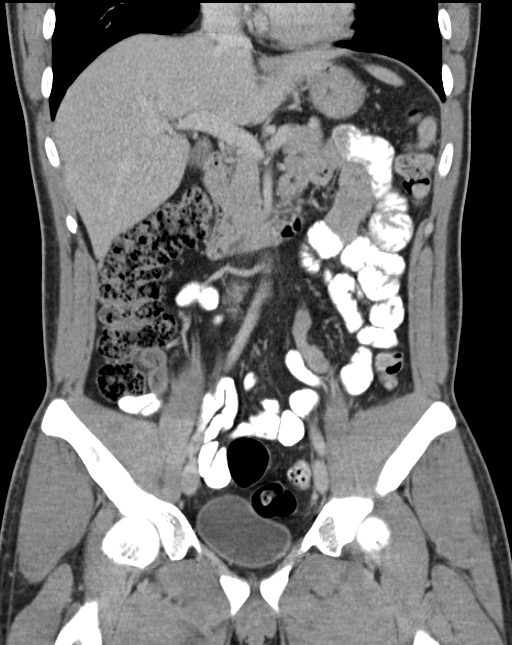
[im 47/84  soft-tissue]
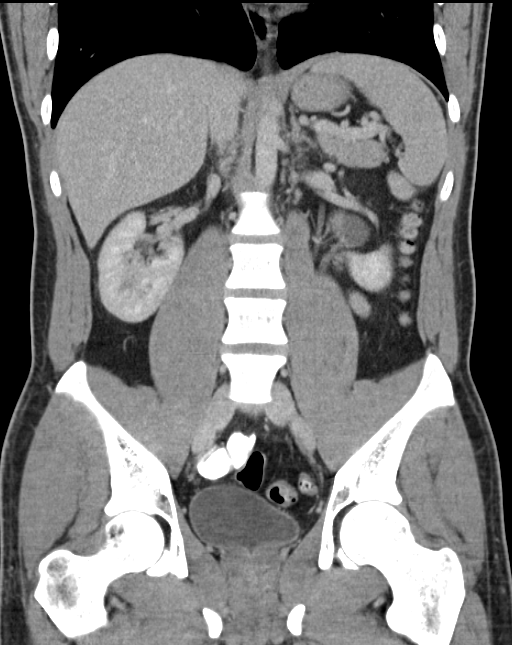

[16 of 46 positions shown; findings below may reference images not displayed]

FINDINGS: Lower chest and abdominal wall: There is swelling in gas in the left
groin correlating with inguinal hernia repair 2 days ago. No
recurrent hernia is seen. Edematous changes extend into the left
spermatic cord and upper scrotum, without fluid collection or
hematoma. Prominent left scrotal veins, but not specific for
varicocele in this setting.

Hepatobiliary: No focal liver abnormality.No evidence of biliary
obstruction or stone.

Pancreas: Unremarkable.

Spleen: Unremarkable.

Adrenals/Urinary Tract: Negative adrenals. Partial duplication of
the left ureter. No hydronephrosis or stone. Unremarkable bladder.

Reproductive:As above.

Stomach/Bowel:  No obstruction. Normal appendix.

Vascular/Lymphatic: No acute vascular abnormality. No mass or
adenopathy.

Peritoneal: Trace pelvic fluid, considered reactive.

Musculoskeletal: No acute abnormalities.
IMPRESSION: Expected findings after recent left inguinal hernia repair. Negative
for recurrence.
